# Patient Record
Sex: Male | Born: 1995 | Race: Black or African American | Hispanic: No | Marital: Single | Smoking: Never smoker
Health system: Southern US, Community
[De-identification: ages and names within clinical notes are randomized; demographics above are authoritative.]

---
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
## 2015-09-24 ENCOUNTER — Encounter (HOSPITAL_COMMUNITY): Payer: Self-pay

## 2015-09-24 ENCOUNTER — Emergency Department (HOSPITAL_COMMUNITY): Payer: Self-pay

## 2015-09-24 ENCOUNTER — Emergency Department (HOSPITAL_COMMUNITY)
Admission: EM | Admit: 2015-09-24 | Discharge: 2015-09-24 | Disposition: A | Discharge status: Home / Self Care | Payer: Self-pay | Attending: Emergency Medicine | Admitting: Emergency Medicine

## 2015-09-24 DIAGNOSIS — S8991XA Unspecified injury of right lower leg, initial encounter: Secondary | ICD-10-CM | POA: Insufficient documentation

## 2015-09-24 DIAGNOSIS — Y92310 Basketball court as the place of occurrence of the external cause: Secondary | ICD-10-CM | POA: Insufficient documentation

## 2015-09-24 DIAGNOSIS — X501XXA Overexertion from prolonged static or awkward postures, initial encounter: Secondary | ICD-10-CM | POA: Insufficient documentation

## 2015-09-24 DIAGNOSIS — Y9367 Activity, basketball: Secondary | ICD-10-CM | POA: Insufficient documentation

## 2015-09-24 DIAGNOSIS — Y998 Other external cause status: Secondary | ICD-10-CM | POA: Insufficient documentation

## 2015-09-24 DIAGNOSIS — M25561 Pain in right knee: Secondary | ICD-10-CM

## 2015-09-24 MED ORDER — IBUPROFEN 400 MG PO TABS
600.0000 mg | ORAL_TABLET | Freq: Once | ORAL | Status: AC
  Administered 2015-09-24: 600 mg via ORAL
  Filled 2015-09-24: qty 1

## 2015-09-24 NOTE — Discharge Instructions (Signed)
Take ibuprofen every 4-6 hours as needed for your pain. Use ice on your knee at least 3-4 times daily alternating 20 minutes on, 20 minutes off. Wear your knee brace when you are walking for support. You may take it off to bathe. Elevate your leg when you are laying down or sitting. Rest your knee and do not play sports until you have seen orthopedics for further evaluation to prevent further injury. Please follow-up with orthopedic doctor, Dr. Linna CapriceSwinteck, and for further evaluation and treatment of your knee pain. Please return to emergency department if you develop any new or worsening symptoms.   Knee Pain Knee pain is a very common symptom and can have many causes. Knee pain often goes away when you follow your health care provider's instructions for relieving pain and discomfort at home. However, knee pain can develop into a condition that needs treatment. Some conditions may include:  Arthritis caused by wear and tear (osteoarthritis).  Arthritis caused by swelling and irritation (rheumatoid arthritis or gout).  A cyst or growth in your knee.  An infection in your knee joint.  An injury that will not heal.  Damage, swelling, or irritation of the tissues that support your knee (torn ligaments or tendinitis). If your knee pain continues, additional tests may be ordered to diagnose your condition. Tests may include X-rays or other imaging studies of your knee. You may also need to have fluid removed from your knee. Treatment for ongoing knee pain depends on the cause, but treatment may include:  Medicines to relieve pain or swelling.  Steroid injections in your knee.  Physical therapy.  Surgery. HOME CARE INSTRUCTIONS  Take medicines only as directed by your health care provider.  Rest your knee and keep it raised (elevated) while you are resting.  Do not do things that cause or worsen pain.  Avoid high-impact activities or exercises, such as running, jumping rope, or doing jumping  jacks.  Apply ice to the knee area:  Put ice in a plastic bag.  Place a towel between your skin and the bag.  Leave the ice on for 20 minutes, 2-3 times a day.  Ask your health care provider if you should wear an elastic knee support.  Keep a pillow under your knee when you sleep.  Lose weight if you are overweight. Extra weight can put pressure on your knee.  Do not use any tobacco products, including cigarettes, chewing tobacco, or electronic cigarettes. If you need help quitting, ask your health care provider. Smoking may slow the healing of any bone and joint problems that you may have. SEEK MEDICAL CARE IF:  Your knee pain continues, changes, or gets worse.  You have a fever along with knee pain.  Your knee buckles or locks up.  Your knee becomes more swollen. SEEK IMMEDIATE MEDICAL CARE IF:   Your knee joint feels hot to the touch.  You have chest pain or trouble breathing.   This information is not intended to replace advice given to you by your health care provider. Make sure you discuss any questions you have with your health care provider.   Document Released: 02/04/2007 Document Revised: 04/30/2014 Document Reviewed: 11/23/2013 Else[employer] Interactive Patient Education [employer]

## 2015-09-24 NOTE — ED Notes (Signed)
Ice bag placed on right knee

## 2015-09-24 NOTE — ED Notes (Signed)
Patient here with right knee pain after twisting sam today while playing basketball. States that he was seen here 2 months ago and told he has torn ligaments in right knee

## 2015-09-24 NOTE — ED Provider Notes (Signed)
CSN: [id number]     Arrival date & time 09/24/15  1759 History   First MD Initiated Contact with Patient 09/24/15 1832     Chief Complaint  Patient presents with  . Knee Pain     (Consider location/radiation/quality/duration/timing/severity/associated sxs/prior Treatment) HPI Comments: Patient is a previously healthy 20 year old male who presents with right knee pain. Patient reports he was playing basketball when he went up for rebound and came down on his knee in a twisting motion. Patient is able to bear weight with some pain. Patient denies any numbness or tingling in his legs. Patient has not taken any medication for this prior to arrival. He has not used ice at home. Patient had similar symptoms in the past and never fully recovered from a similar injury in August. Patient was told that he may have torn ligaments and that he should follow-up with orthopedics and not play any sports. Patient never follow-up with orthopedics and continued playing sports. Patient denies any fevers, chest pain, shortness of breath, abdominal pain, nausea, vomiting, dysuria.  Patient is a 20 y.o. male presenting with knee pain. The history is provided by the patient.  Knee Pain Associated symptoms: no back pain and no fever     History reviewed. No pertinent past medical history. History reviewed. No pertinent past surgical history. No family history on file. Social History  Substance Use Topics  . Smoking status: Never Smoker   . Smokeless tobacco: None  . Alcohol Use: No    Review of Systems  Constitutional: Negative for fever and chills.  HENT: Negative for facial swelling and sore throat.   Respiratory: Negative for shortness of breath.   Cardiovascular: Negative for chest pain.  Gastrointestinal: Negative for nausea, vomiting and abdominal pain.  Genitourinary: Negative for dysuria.  Musculoskeletal: Positive for joint swelling and arthralgias. Negative for back pain.  Skin: Negative for rash  and wound.  Neurological: Negative for headaches.  Psychiatric/Behavioral: The patient is not nervous/anxious.       Allergies  Review of patient's allergies indicates no known allergies.  Home Medications   Prior to Admission medications   Medication Sig Start Date End Date Taking? Authorizing Provider  ibuprofen (ADVIL,MOTRIN) 600 MG tablet Take 1 tablet (600 mg total) by mouth every 6 (six) hours as needed. 12/19/14   Roxy Horseman, PA-C   BP 117/75 mmHg  Pulse 60  Temp(Src) 97.6 F (36.4 C) (Oral)  Resp 16  Ht 6' (1.829 m)  Wt 79.379 kg  BMI 23.73 kg/m2  SpO2 100% Physical Exam  Constitutional: He appears well-developed and well-nourished. No distress.  HENT:  Head: Normocephalic and atraumatic.  Mouth/Throat: Oropharynx is clear and moist. No oropharyngeal exudate.  Eyes: Conjunctivae are normal. Pupils are equal, round, and reactive to light. Right eye exhibits no discharge. Left eye exhibits no discharge. No scleral icterus.  Neck: Normal range of motion. Neck supple. No thyromegaly present.  Cardiovascular: Normal rate, regular rhythm, normal heart sounds and intact distal pulses.  Exam reveals no gallop and no friction rub.   No murmur heard. Pulmonary/Chest: Effort normal and breath sounds normal. No stridor. No respiratory distress. He has no wheezes. He has no rales.  Abdominal: Soft. Bowel sounds are normal. He exhibits no distension. There is no tenderness. There is no rebound and no guarding.  Musculoskeletal: He exhibits no edema.       Right knee: Tenderness found.  Right knee: Tenderness to anterior joint line; Negative anterior/posterior drawer, positive McMurray's, pain with varus  and valgus stress in anterior knee, no laxity felt; patient ambulatory; full range of motion with pain; no warmth or erythema to the joint; 5/5 strength in lower extremities throughout  Lymphadenopathy:    He has no cervical adenopathy.  Neurological: He is alert. Coordination  normal.  Normal sensation to lower extremities  Skin: Skin is warm and dry. No rash noted. He is not diaphoretic. No pallor.  Psychiatric: He has a normal mood and affect.  Nursing note and vitals reviewed.   ED Course  Procedures (including critical care time) Labs Review Labs Reviewed - No data to display  Imaging Review Dg Knee Complete 4 Views Right  09/24/2015  CLINICAL DATA:  20 year old male with acute right knee pain following twisting injury today. Initial encounter. EXAM: RIGHT KNEE - COMPLETE 4+ VIEW COMPARISON:  12/19/2014 FINDINGS: There is no evidence of acute fracture, subluxation or dislocation. A small knee effusion is present. No focal bony lesions are identified. IMPRESSION: Small knee effusion without acute bony abnormality. Electronically Signed   By: Harmon PierJeffrey  Hu M.D.   On: 09/24/2015 19:19   I have personally reviewed and evaluated these images and lab results as part of my medical decision-making.   EKG Interpretation None      MDM   Patient ambulatory. Right knee x-ray shows small knee effusion without acute bony abnormality. Patient given knee sleeve with follow-up to orthopedics. I stressed the importance of following up with orthopedics this time. Supportive care discussed such as ice and NSAIDs. Patient advised to rest knee and and to stop playing sports to prevent reinjury until orthopedic follow-up. Return precautions discussed. Patient vitals stable throughout ED course and discharged in satisfactory condition.  Final diagnoses:  Right knee pain        Emi Holeslexandra M Dalyce Renne, PA-C 09/24/15 2233  Gerhard Munchobert Lockwood, MD 09/24/15 [phone]

## 2017-08-23 ENCOUNTER — Other Ambulatory Visit: Payer: Self-pay

## 2017-08-23 ENCOUNTER — Emergency Department (HOSPITAL_COMMUNITY)
Admission: EM | Admit: 2017-08-23 | Discharge: 2017-08-23 | Disposition: A | Discharge status: Home / Self Care | Payer: Self-pay | Attending: Emergency Medicine | Admitting: Emergency Medicine

## 2017-08-23 ENCOUNTER — Encounter (HOSPITAL_COMMUNITY): Payer: Self-pay

## 2017-08-23 DIAGNOSIS — J029 Acute pharyngitis, unspecified: Secondary | ICD-10-CM | POA: Insufficient documentation

## 2017-08-23 DIAGNOSIS — Z79899 Other long term (current) drug therapy: Secondary | ICD-10-CM | POA: Insufficient documentation

## 2017-08-23 LAB — GROUP A STREP BY PCR: Group A Strep by PCR: NOT DETECTED

## 2017-08-23 MED ORDER — ACETAMINOPHEN 325 MG PO TABS
650.0000 mg | ORAL_TABLET | Freq: Once | ORAL | Status: AC
Start: 2017-08-23 — End: 2017-08-23
  Administered 2017-08-23: 650 mg via ORAL
  Filled 2017-08-23: qty 2

## 2017-08-23 NOTE — ED Triage Notes (Signed)
Pt endorses sore throat x 2 days. Denies cough or fever. VSS

## 2017-08-23 NOTE — ED Provider Notes (Signed)
[facility] EMERGENCY DEPARTMENT Provider Note   CSN: [id number] Arrival date & time: 08/23/17  2120     History   Chief Complaint Chief Complaint  Patient presents with  . Sore Throat    HPI Jeremy Morgan is a 22 y.o. male who presents emergency department chief complaint of sore throat.  Patient symptoms began 2 days ago.  He states that he has pain with swallowing.  He is tried a cough drop without significant relief.  He denies change in voice or difficulty swallowing.  He denies ear pain, nasal congestion or cough.  He did not have any fever, no contacts with similar symptoms.  HPI  History reviewed. No pertinent past medical history.  There are no active problems to display for this patient.   History reviewed. No pertinent surgical history.      Home Medications    Prior to Admission medications   Medication Sig Start Date End Date Taking? Authorizing Provider  ibuprofen (ADVIL,MOTRIN) 600 MG tablet Take 1 tablet (600 mg total) by mouth every 6 (six) hours as needed. 12/19/14   Roxy Horseman, PA-C    Family History History reviewed. No pertinent family history.  Social History Social History   Tobacco Use  . Smoking status: Never Smoker  Substance Use Topics  . Alcohol use: No  . Drug use: No     Allergies   Patient has no known allergies.   Review of Systems Review of Systems  Constitutional: Negative for chills and fever.  HENT: Positive for sore throat and trouble swallowing. Negative for congestion, ear pain, mouth sores, sinus pressure, sinus pain and voice change.       Physical Exam Updated Vital Signs BP (!) 152/83 (BP Location: Right Arm)   Pulse 78   Temp 98.7 F (37.1 C) (Oral)   Resp 16   Ht  (1.803 m)   Wt 83.9 kg (185 lb)   SpO2 100%   BMI 25.80 kg/m   Physical Exam  Constitutional: He appears well-developed and well-nourished. No distress.  HENT:  Head: Normocephalic and atraumatic.    Mouth/Throat: Uvula is midline. No oral lesions. No trismus in the jaw. No uvula swelling. Posterior oropharyngeal erythema present. No oropharyngeal exudate or posterior oropharyngeal edema.  Eyes: Conjunctivae are normal. No scleral icterus.  Neck: Normal range of motion. Neck supple.  Cardiovascular: Normal rate, regular rhythm and normal heart sounds.  Pulmonary/Chest: Effort normal and breath sounds normal. No respiratory distress.  Abdominal: Soft. There is no tenderness.  Musculoskeletal: He exhibits no edema.  Neurological: He is alert.  Skin: Skin is warm and dry. He is not diaphoretic.  Psychiatric: His behavior is normal.  Nursing note and vitals reviewed.    ED Treatments / Results  Labs (all labs ordered are listed, but only abnormal results are displayed) Labs Reviewed  GROUP A STREP BY PCR    EKG None  Radiology No results found.  Procedures Procedures (including critical care time)  Medications Ordered in ED Medications  acetaminophen (TYLENOL) tablet 650 mg (has no administration in time range)     Initial Impression / Assessment and Plan / ED Course  I have reviewed the triage vital signs and the nursing notes.  Pertinent labs & imaging results that were available during my care of the patient were reviewed by me and considered in my medical decision making (see chart for details).      Pt afebrile without tonsillar exudate, negative strep. Presents  with mild cervical lymphadenopathy, & dysphagia; diagnosis of viral pharyngitis. No abx indicated. DC w symptomatic tx for pain  Pt does not appear dehydrated, but did discuss importance of water rehydration. Presentation non concerning for PTA or infxn spread to soft tissue. No trismus or uvula deviation. Specific return precautions discussed. Pt able to drink water in ED without difficulty with intact air way. Recommended PCP follow up. .  Final Clinical Impressions(s) / ED Diagnoses   Final  diagnoses:  Sore throat    ED Discharge Orders    None       Arthor Captain, PA-C 08/23/17 2215    Raeford Razor, MD 08/24/17 [phone]

## 2017-08-23 NOTE — Discharge Instructions (Signed)
Your Strep test was negative.  Contact a health care provider if: You have a fever for more than 2-3 days. You have symptoms that last (are persistent) for more than 2-3 days. Your throat does not get better within 7 days. You have a fever and your symptoms suddenly get worse. Get help right away if: You have difficulty breathing. You cannot swallow fluids, soft foods, or your saliva. You have increased swelling in your throat or neck. You have persistent nausea and vomiting.

## 2017-08-23 NOTE — ED Notes (Signed)
Pt is fast track pt, see provider's assessment

## 2017-08-23 NOTE — ED Notes (Signed)
ED Provider at bedside. 

## 2017-10-30 ENCOUNTER — Emergency Department (HOSPITAL_COMMUNITY): Payer: BLUE CROSS/BLUE SHIELD

## 2017-10-30 ENCOUNTER — Encounter (HOSPITAL_COMMUNITY): Payer: Self-pay | Admitting: *Deleted

## 2017-10-30 ENCOUNTER — Other Ambulatory Visit: Payer: Self-pay

## 2017-10-30 ENCOUNTER — Emergency Department (HOSPITAL_COMMUNITY)
Admission: EM | Admit: 2017-10-30 | Discharge: 2017-10-30 | Disposition: A | Discharge status: Home / Self Care | Payer: BLUE CROSS/BLUE SHIELD | Attending: Emergency Medicine | Admitting: Emergency Medicine

## 2017-10-30 DIAGNOSIS — S71132A Puncture wound without foreign body, left thigh, initial encounter: Secondary | ICD-10-CM | POA: Insufficient documentation

## 2017-10-30 DIAGNOSIS — Y92512 Supermarket, store or market as the place of occurrence of the external cause: Secondary | ICD-10-CM | POA: Insufficient documentation

## 2017-10-30 DIAGNOSIS — S79922A Unspecified injury of left thigh, initial encounter: Secondary | ICD-10-CM | POA: Diagnosis present

## 2017-10-30 DIAGNOSIS — Y939 Activity, unspecified: Secondary | ICD-10-CM | POA: Insufficient documentation

## 2017-10-30 DIAGNOSIS — Y999 Unspecified external cause status: Secondary | ICD-10-CM | POA: Insufficient documentation

## 2017-10-30 DIAGNOSIS — W3400XA Accidental discharge from unspecified firearms or gun, initial encounter: Secondary | ICD-10-CM

## 2017-10-30 MED ORDER — CEPHALEXIN 500 MG PO CAPS: 500.0000 mg | ORAL_CAPSULE | Freq: Four times a day (QID) | ORAL | 0 refills | Status: AC
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
## 2017-10-30 NOTE — ED Notes (Signed)
Bloodwork sent to lab for hold.

## 2017-10-30 NOTE — ED Notes (Signed)
Patient ambulated in room without difficulty. Dr. Rubin PayorPickering at bedside. . Father at bedside.
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
